# Patient Record
Sex: Female | Born: 2005
Health system: Southern US, Community
[De-identification: ages and names within clinical notes are randomized; demographics above are authoritative.]

---
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
## 2024-03-05 DIAGNOSIS — F332 Major depressive disorder, recurrent severe without psychotic features: Secondary | ICD-10-CM | POA: Diagnosis not present

## 2024-03-05 DIAGNOSIS — F411 Generalized anxiety disorder: Secondary | ICD-10-CM | POA: Diagnosis not present

## 2024-04-09 DIAGNOSIS — F332 Major depressive disorder, recurrent severe without psychotic features: Secondary | ICD-10-CM | POA: Diagnosis not present

## 2024-04-09 DIAGNOSIS — Z0000 Encounter for general adult medical examination without abnormal findings: Secondary | ICD-10-CM | POA: Diagnosis not present

## 2024-04-09 DIAGNOSIS — Z68.41 Body mass index (BMI) pediatric, greater than or equal to 140% of the 95th percentile for age: Secondary | ICD-10-CM | POA: Diagnosis not present
